# Patient Record
Sex: Female | Born: 1975 | Race: White | Hispanic: No | Marital: Single | Smoking: Never smoker
Health system: Southern US, Community
[De-identification: ages and names within clinical notes are randomized; demographics above are authoritative.]

## PROBLEM LIST (undated history)

## (undated) DIAGNOSIS — E119 Type 2 diabetes mellitus without complications: Secondary | ICD-10-CM

## (undated) DIAGNOSIS — I10 Essential (primary) hypertension: Secondary | ICD-10-CM

---

## 2000-07-02 ENCOUNTER — Emergency Department (HOSPITAL_COMMUNITY): Admission: EM | Admit: 2000-07-02 | Discharge: 2000-07-02 | Payer: Self-pay | Admitting: Emergency Medicine

## 2000-07-02 ENCOUNTER — Encounter: Payer: Self-pay | Admitting: Emergency Medicine

## 2015-07-31 ENCOUNTER — Other Ambulatory Visit: Payer: Self-pay | Admitting: Family Medicine

## 2015-07-31 ENCOUNTER — Ambulatory Visit (INDEPENDENT_AMBULATORY_CARE_PROVIDER_SITE_OTHER): Payer: BLUE CROSS/BLUE SHIELD

## 2015-07-31 DIAGNOSIS — R05 Cough: Secondary | ICD-10-CM

## 2015-07-31 DIAGNOSIS — R059 Cough, unspecified: Secondary | ICD-10-CM

## 2017-01-15 IMAGING — CR DG CHEST 2V
2 series · 2 of 2 positions shown · non-contrast
Comparison: None.

CLINICAL DATA: Or cough, chest tightness for over 2 weeks.

EXAM:
CHEST  2 VIEW

[chest pa]
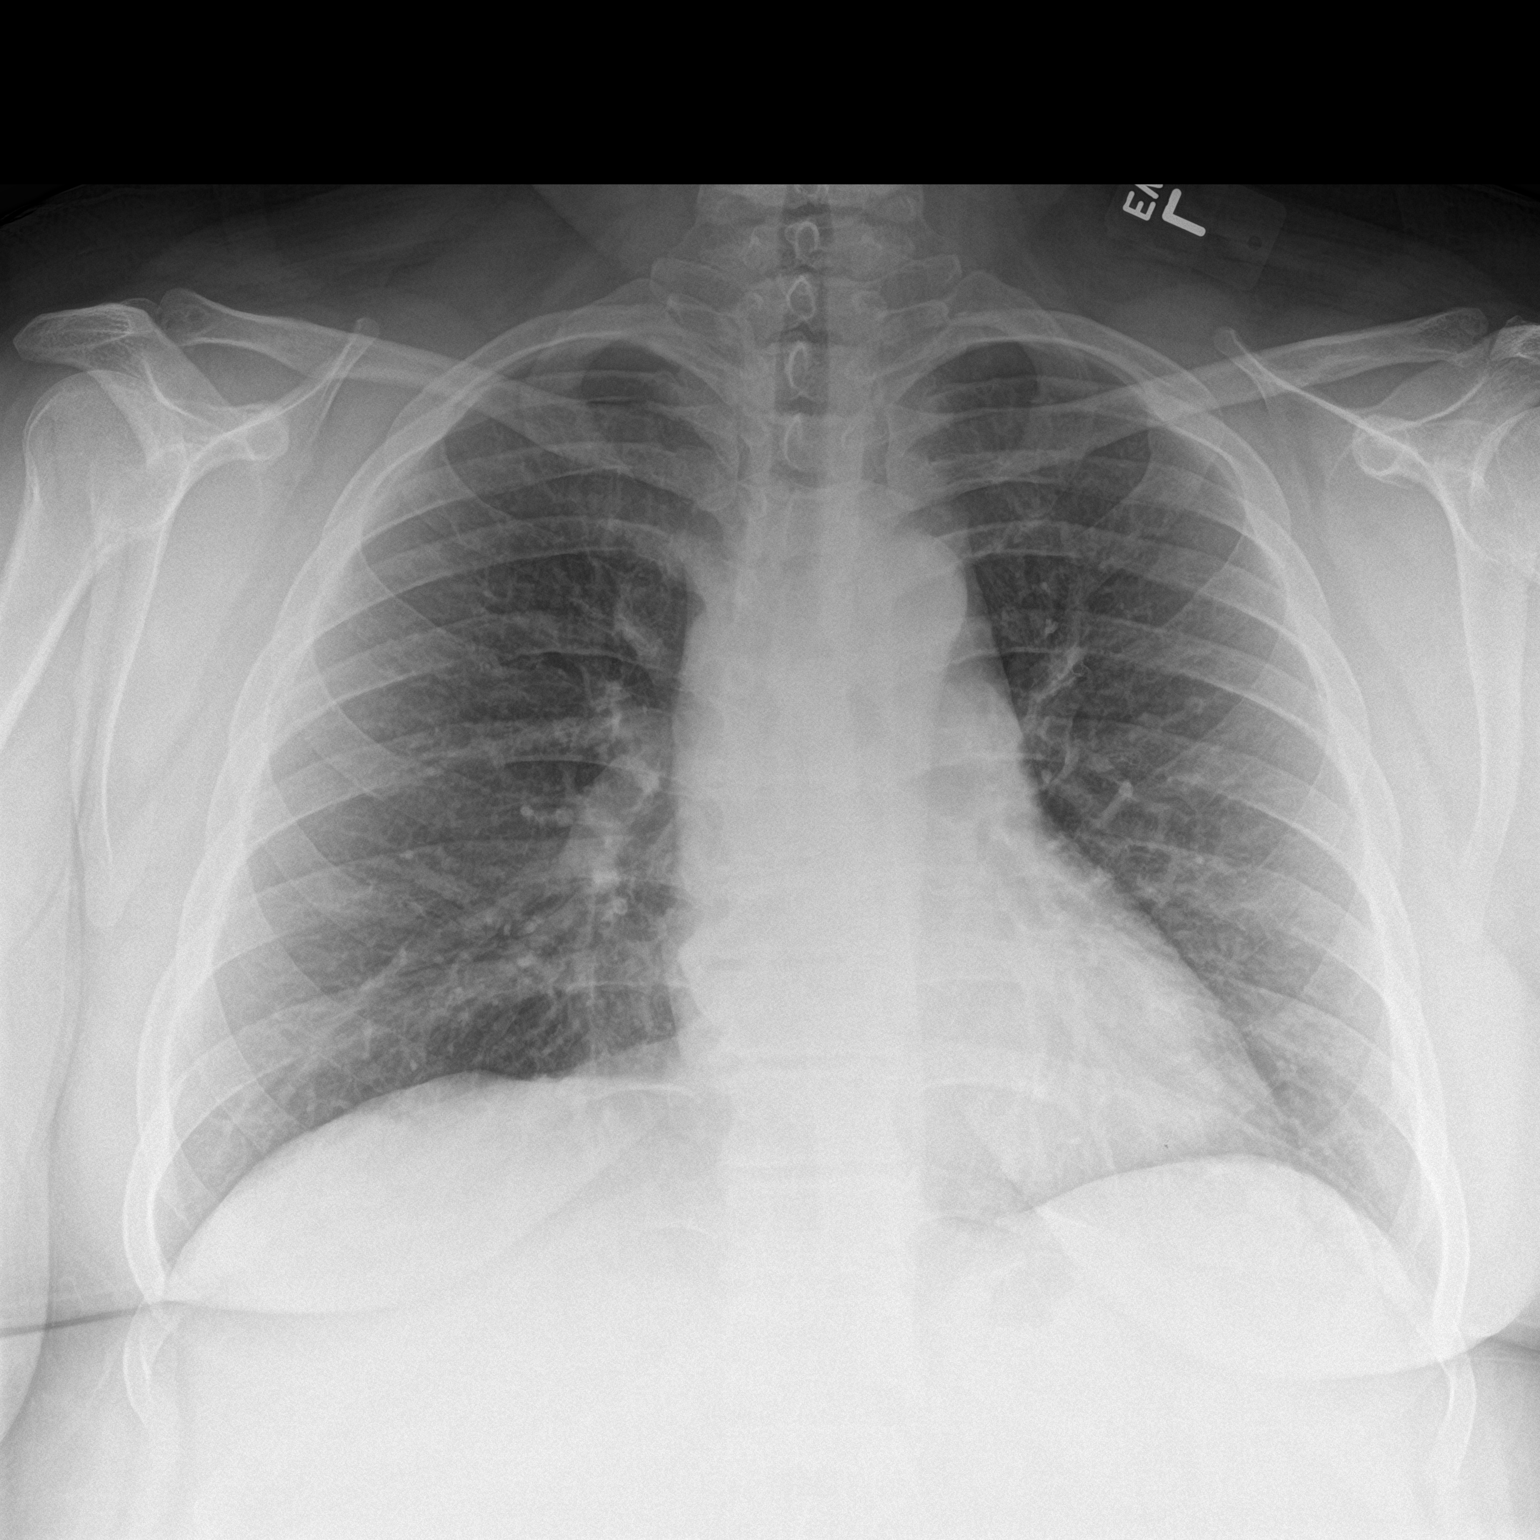

[chest lat]
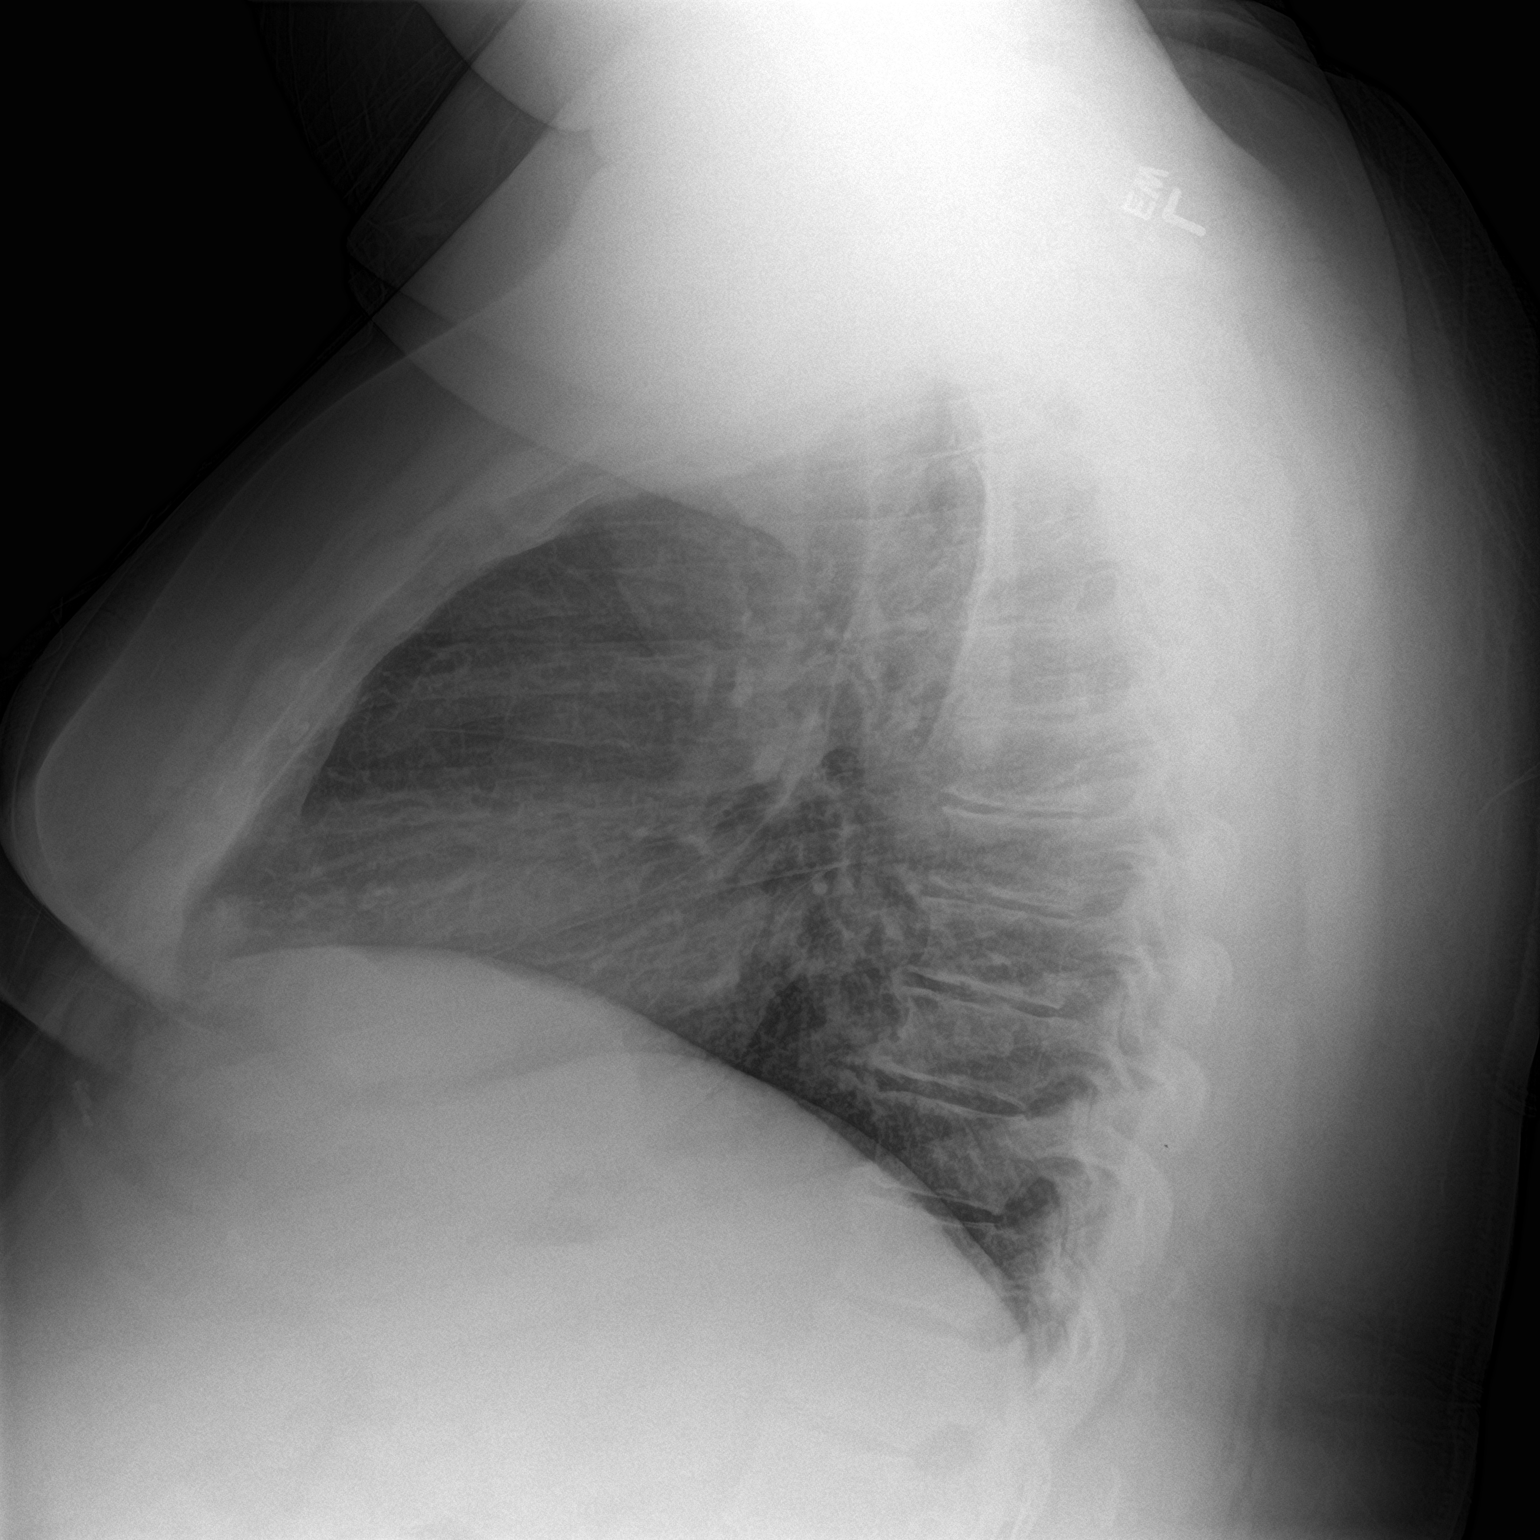

[2 of 2 positions shown; findings below may reference images not displayed]

FINDINGS: The heart size and mediastinal contours are within normal limits.
Both lungs are clear. The visualized skeletal structures are
unremarkable.
IMPRESSION: No active cardiopulmonary disease.

## 2019-05-20 ENCOUNTER — Encounter: Payer: Self-pay | Admitting: Family Medicine

## 2019-05-20 ENCOUNTER — Emergency Department
Admission: EM | Admit: 2019-05-20 | Discharge: 2019-05-20 | Disposition: A | Discharge status: Home / Self Care | Payer: BLUE CROSS/BLUE SHIELD | Source: Home / Self Care

## 2019-05-20 ENCOUNTER — Other Ambulatory Visit: Payer: Self-pay

## 2019-05-20 DIAGNOSIS — R350 Frequency of micturition: Secondary | ICD-10-CM | POA: Diagnosis not present

## 2019-05-20 DIAGNOSIS — N390 Urinary tract infection, site not specified: Secondary | ICD-10-CM

## 2019-05-20 HISTORY — DX: Type 2 diabetes mellitus without complications: E11.9

## 2019-05-20 HISTORY — DX: Essential (primary) hypertension: I10

## 2019-05-20 LAB — POCT URINALYSIS DIP (MANUAL ENTRY)
Glucose, UA: NEGATIVE mg/dL
Nitrite, UA: NEGATIVE
Protein Ur, POC: 100 mg/dL — AB
Spec Grav, UA: >=1.03 — AB (ref 1.010–1.025)
Urobilinogen, UA: 0.2 E.U./dL
pH, UA: 5 (ref 5.0–8.0)

## 2019-05-20 MED ORDER — FLUCONAZOLE 150 MG PO TABS: 150.0000 mg | ORAL_TABLET | Freq: Once | ORAL | 0 refills | Status: AC

## 2019-05-20 MED ORDER — CEPHALEXIN 500 MG PO CAPS: 500.0000 mg | ORAL_CAPSULE | Freq: Three times a day (TID) | ORAL | 0 refills | Status: AC

## 2019-05-20 NOTE — ED Provider Notes (Addendum)
[facility]    CSN: [id number] Arrival date & time: 05/20/19  6237      History   Chief Complaint Chief Complaint  Patient presents with  . bladder pain  . Chills  . Urinary Frequency    HPI Mallory Hopkins is a 43 y.o. female.   43 yo woman presenting for initial [facility] visit, complaining of UTI symptoms:  Chills, frequency and suprapubic pain for the past 2 days.  No dysuria.  Patient's last UTI was 6 months ago.  She is on Humira for hidradenitis and always has blood in U/A from open sores on perineum.  No vomiting, but patient does have chronic nausea.     Past Medical History:  Diagnosis Date  . Diabetes mellitus without complication (HCC)   . Hypertension     There are no active problems to display for this patient.   History reviewed. No pertinent surgical history.  OB History   No obstetric history on file.      Home Medications    Prior to Admission medications   Medication Sig Start Date End Date Taking? Authorizing Provider  albuterol (VENTOLIN HFA) 108 (90 Base) MCG/ACT inhaler INHALE 2 PUFFS INTO LUNGS EVERY 4 HOURS AS NEEDED FOR WHEEZING 08/04/17  Yes [provider]  gabapentin (NEURONTIN) 300 MG capsule Take 2 tabs in AM, 2 tabs at lunch and 2 tabs QHS 08/24/17 02/23/20 Yes [provider]  loratadine (CLARITIN) 10 MG tablet TAKE 1 TABLET BY MOUTH ONCE DAILY 08/09/17  Yes [provider]  losartan-hydrochlorothiazide (HYZAAR) 50-12.5 MG tablet TAKE 1 TABLET BY MOUTH EVERY DAY 03/10/19  Yes [provider]  meloxicam (MOBIC) 15 MG tablet Take by mouth. 09/22/18 02/20/20 Yes [provider]  metFORMIN (GLUCOPHAGE-XR) 500 MG 24 hr tablet Take 1 tablet by mouth twice daily 01/09/19  Yes [provider]  norethindrone (MICRONOR) 0.35 MG tablet Take by mouth. 09/07/16  Yes [provider]  Vitamin D, Ergocalciferol, (DRISDOL) 1.25 MG (50000 UT) CAPS capsule TAKE 1 CAPSULE BY MOUTH  ONCE A WEEK FOR 12 DOSES 02/01/19  Yes [provider]  Adalimumab (HUMIRA PEN) 40 MG/0.8ML PNKT Inject into the skin.    [provider]  cephALEXin (KEFLEX) 500 MG capsule Take 1 capsule (500 mg total) by mouth 3 (three) times daily. 05/20/19   Elvina Sidle, MD  citalopram (CELEXA) 20 MG tablet Take 20 mg by mouth daily. 03/13/19   [provider]  fluconazole (DIFLUCAN) 150 MG tablet Take 1 tablet (150 mg total) by mouth once for 1 dose. Repeat if needed 05/20/19 05/20/19  Elvina Sidle, MD  glipiZIDE (GLUCOTROL XL) 5 MG 24 hr tablet Take 5 mg by mouth 2 (two) times daily. 04/29/19   [provider]  Ibuprofen 200 MG CAPS Take by mouth.    [provider]  Ivermectin 1 % CREA Apply topically.    [provider]  OZEMPIC, 0.25 OR 0.5 MG/DOSE, 2 MG/1.5ML SOPN INJECT 0.19ML ( 0.25MG  ) INTO THE SKIN ONCE A WEEK 05/14/19   [provider]    Family History History reviewed. No pertinent family history.  Social History Social History   Tobacco Use  . Smoking status: Never Smoker  . Smokeless tobacco: Never Used  Substance Use Topics  . Alcohol use: Never    Frequency: Never  . Drug use: Never     Allergies   Amoxicillin, Ciprofloxacin, Doxycycline, Erythromycin, Rifampin, and Sulfa antibiotics   Review of Systems Review  of Systems  Constitutional: Positive for chills.  Genitourinary: Positive for frequency.  All other systems reviewed and are negative.    Physical Exam Triage Vital Signs ED Triage Vitals  Enc Vitals Group     BP      Pulse      Resp      Temp      Temp src      SpO2      Weight      Height      Head Circumference      Peak Flow      Pain Score      Pain Loc      Pain Edu?      Excl. in GC?    No data found.  Updated Vital Signs BP (!) 149/85 (BP Location: Right Arm)   Pulse 100   Temp 98.6 F (37 C) (Oral)   Resp (!) 22   Ht 5\' 9"  (1.753 m)   Wt (!) 165.1 kg   LMP  05/20/2019   SpO2 98%   BMI 53.75 kg/m    Physical Exam Vitals signs and nursing note reviewed.  Constitutional:      General: She is not in acute distress.    Appearance: Normal appearance. She is obese. She is not toxic-appearing.  Eyes:     Conjunctiva/sclera: Conjunctivae normal.  Neck:     Musculoskeletal: Normal range of motion and neck supple.  Cardiovascular:     Rate and Rhythm: Normal rate.  Pulmonary:     Effort: Pulmonary effort is normal.  Musculoskeletal: Normal range of motion.  Skin:    General: Skin is warm and dry.  Neurological:     General: No focal deficit present.     Mental Status: She is alert.  Psychiatric:        Mood and Affect: Mood normal.        Behavior: Behavior normal.        Thought Content: Thought content normal.        Judgment: Judgment normal.      UC Treatments / Results  Labs (all labs ordered are listed, but only abnormal results are displayed) Labs Reviewed  POCT URINALYSIS DIP (MANUAL ENTRY) - Abnormal; Notable for the following components:      Result Value   Color, UA red (*)    Clarity, UA cloudy (*)    Bilirubin, UA small (*)    Ketones, POC UA trace (5) (*)    Spec Grav, UA >=1.030 (*)    Blood, UA large (*)    Protein Ur, POC =100 (*)    Leukocytes, UA Small (1+) (*)    All other components within normal limits  URINE CULTURE    EKG   Radiology No results found.  Procedures Procedures (including critical care time)  Medications Ordered in UC Medications - No data to display  Initial Impression / Assessment and Plan / UC Course  I have reviewed the triage vital signs and the nursing notes.  Pertinent labs & imaging results that were available during my care of the patient were reviewed by me and considered in my medical decision making (see chart for details).    Final Clinical Impressions(s) / UC Diagnoses   Final diagnoses:  Urinary frequency  Lower urinary tract infectious disease    Discharge Instructions   None    ED Prescriptions    Medication Sig Dispense Auth. Provider   cephALEXin (KEFLEX) 500 MG capsule Take  1 capsule (500 mg total) by mouth 3 (three) times daily. 20 capsule Robyn Haber, MD   fluconazole (DIFLUCAN) 150 MG tablet Take 1 tablet (150 mg total) by mouth once for 1 dose. Repeat if needed 2 tablet Robyn Haber, MD     I have reviewed the PDMP during this encounter.   Robyn Haber, MD 05/20/19 1040    Robyn Haber, MD 05/20/19 1041

## 2019-05-20 NOTE — ED Triage Notes (Signed)
Patient c/o 2 days of chills, urinary frequency and urgency. She taken cranberry daily, Reports she took an AZO UTI test that was positive.

## 2019-05-21 LAB — URINE CULTURE
MICRO NUMBER:: 977836
SPECIMEN QUALITY:: ADEQUATE

## 2019-05-23 ENCOUNTER — Telehealth (HOSPITAL_COMMUNITY): Payer: Self-pay | Admitting: Emergency Medicine

## 2019-05-23 NOTE — Telephone Encounter (Signed)
Patient contacted and made aware of    results, all questions answered Pt staets "I think i'm getting better". Only 50,000 max, informed pt what this means, and to finish antibiotics and follow up if still having symptoms.
